# Patient Record
Sex: Male | Born: 1970 | Race: White | Hispanic: No | Marital: Single | Smoking: Current some day smoker
Health system: Southern US, Community
[De-identification: ages and names within clinical notes are randomized; demographics above are authoritative.]

## PROBLEM LIST (undated history)

## (undated) HISTORY — PX: LEG SURGERY: SHX1003

## (undated) HISTORY — PX: HEMORROIDECTOMY: SUR656

---

## 2013-01-19 ENCOUNTER — Emergency Department (HOSPITAL_BASED_OUTPATIENT_CLINIC_OR_DEPARTMENT_OTHER)
Admission: EM | Admit: 2013-01-19 | Discharge: 2013-01-19 | Disposition: A | Discharge status: Home / Self Care | Payer: Self-pay | Attending: Emergency Medicine | Admitting: Emergency Medicine

## 2013-01-19 ENCOUNTER — Encounter (HOSPITAL_BASED_OUTPATIENT_CLINIC_OR_DEPARTMENT_OTHER): Payer: Self-pay

## 2013-01-19 DIAGNOSIS — R51 Headache: Secondary | ICD-10-CM | POA: Insufficient documentation

## 2013-01-19 DIAGNOSIS — Y9289 Other specified places as the place of occurrence of the external cause: Secondary | ICD-10-CM | POA: Insufficient documentation

## 2013-01-19 DIAGNOSIS — W57XXXD Bitten or stung by nonvenomous insect and other nonvenomous arthropods, subsequent encounter: Secondary | ICD-10-CM

## 2013-01-19 DIAGNOSIS — W57XXXA Bitten or stung by nonvenomous insect and other nonvenomous arthropods, initial encounter: Secondary | ICD-10-CM | POA: Insufficient documentation

## 2013-01-19 DIAGNOSIS — Y939 Activity, unspecified: Secondary | ICD-10-CM | POA: Insufficient documentation

## 2013-01-19 DIAGNOSIS — Z87891 Personal history of nicotine dependence: Secondary | ICD-10-CM | POA: Insufficient documentation

## 2013-01-19 DIAGNOSIS — S0006XD Insect bite (nonvenomous) of scalp, subsequent encounter: Secondary | ICD-10-CM

## 2013-01-19 DIAGNOSIS — S1096XA Insect bite of unspecified part of neck, initial encounter: Secondary | ICD-10-CM | POA: Insufficient documentation

## 2013-01-19 DIAGNOSIS — IMO0001 Reserved for inherently not codable concepts without codable children: Secondary | ICD-10-CM | POA: Insufficient documentation

## 2013-01-19 DIAGNOSIS — R110 Nausea: Secondary | ICD-10-CM | POA: Insufficient documentation

## 2013-01-19 MED ORDER — DOXYCYCLINE HYCLATE 100 MG PO CAPS: 100.0000 mg | ORAL_CAPSULE | Freq: Two times a day (BID) | ORAL | Status: DC

## 2013-01-19 MED ORDER — HYDROCODONE-ACETAMINOPHEN 5-325 MG PO TABS: 2.0000 | ORAL_TABLET | ORAL | Status: DC | PRN

## 2013-01-19 NOTE — ED Notes (Signed)
Small reddened area noted to posterior aspect of left side of neck.

## 2013-01-19 NOTE — ED Provider Notes (Signed)
History     CSN: [id number]  Arrival date & time 01/19/13  1056   First MD Initiated Contact with Patient 01/19/13 1213      Chief Complaint  Patient presents with  . Tick Removal    (Consider location/radiation/quality/duration/timing/severity/associated sxs/prior treatment) Patient is a 42 y.o. male presenting with musculoskeletal pain. The history is provided by the patient. No language interpreter was used.  Muscle Pain This is a new problem. The current episode started today. The problem occurs constantly. The problem has been unchanged. Associated symptoms include headaches and nausea. Pertinent negatives include no fever. Nothing aggravates the symptoms. He has tried nothing for the symptoms. The treatment provided moderate relief.    History reviewed. No pertinent past medical history.  Past Surgical History  Procedure Laterality Date  . Leg surgery    . Hemorroidectomy      No family history on file.  History  Substance Use Topics  . Smoking status: Former Games developer  . Smokeless tobacco: Not on file  . Alcohol Use: Yes     Comment: occasional      Review of Systems  Constitutional: Negative for fever.  Gastrointestinal: Positive for nausea.  Neurological: Positive for headaches.  All other systems reviewed and are negative.    Allergies  Review of patient's allergies indicates no known allergies.  Home Medications   Current Outpatient Rx  Name  Route  Sig  Dispense  Refill  . doxycycline (VIBRAMYCIN) 100 MG capsule   Oral   Take 1 capsule (100 mg total) by mouth 2 (two) times daily.   28 capsule   0   . HYDROcodone-acetaminophen (NORCO/VICODIN) 5-325 MG per tablet   Oral   Take 2 tablets by mouth every 4 (four) hours as needed for pain.   10 tablet   0     BP 122/84  Pulse 73  Temp(Src) 97.4 F (36.3 C) (Oral)  Resp 20  Ht 5\' 11"  (1.803 m)  Wt 165 lb (74.844 kg)  BMI 23.02 kg/m2  SpO2 98%  Physical Exam  Nursing note and vitals  reviewed. Constitutional: He appears well-developed and well-nourished.  HENT:  Head: Normocephalic.  Right Ear: External ear normal.  Left Ear: External ear normal.  Nose: Nose normal.  Mouth/Throat: Oropharynx is clear and moist.  Eyes: Conjunctivae and EOM are normal. Pupils are equal, round, and reactive to light.  Neck: Normal range of motion.  Cardiovascular: Normal rate.   Pulmonary/Chest: Effort normal.  Abdominal: Soft.  Musculoskeletal: He exhibits edema.  Neurological: He is alert.  Skin: Skin is warm.  Psychiatric: He has a normal mood and affect.    ED Course  Procedures (including critical care time)  Labs Reviewed - No data to display No results found.   1. Tick bite of postauricular region, left, subsequent encounter       MDM  Pt given rx for hydrocodone.  Doxycycline.   Pt advised to follow up with family MD or at acute care clinic        Elson Areas, PA-C 01/19/13 1302

## 2013-01-19 NOTE — ED Notes (Signed)
Karen Sofia, PA-C at bedside 

## 2013-01-19 NOTE — ED Provider Notes (Signed)
Medical screening examination/treatment/procedure(s) were performed by non-physician practitioner and as supervising physician I was immediately available for consultation/collaboration.   Rolan Bucco, MD 01/19/13 [phone]

## 2013-01-19 NOTE — ED Notes (Signed)
Pt reports he removed a large tick from his neck Sunday and has not felt well since.  He reports sweats and a headache.

## 2013-03-28 ENCOUNTER — Encounter (HOSPITAL_BASED_OUTPATIENT_CLINIC_OR_DEPARTMENT_OTHER): Payer: Self-pay | Admitting: *Deleted

## 2013-03-28 ENCOUNTER — Emergency Department (HOSPITAL_BASED_OUTPATIENT_CLINIC_OR_DEPARTMENT_OTHER): Payer: Self-pay

## 2013-03-28 ENCOUNTER — Emergency Department (HOSPITAL_BASED_OUTPATIENT_CLINIC_OR_DEPARTMENT_OTHER)
Admission: EM | Admit: 2013-03-28 | Discharge: 2013-03-28 | Disposition: A | Discharge status: Home / Self Care | Payer: Self-pay | Attending: Emergency Medicine | Admitting: Emergency Medicine

## 2013-03-28 DIAGNOSIS — Z87891 Personal history of nicotine dependence: Secondary | ICD-10-CM | POA: Insufficient documentation

## 2013-03-28 DIAGNOSIS — J40 Bronchitis, not specified as acute or chronic: Secondary | ICD-10-CM

## 2013-03-28 DIAGNOSIS — R0982 Postnasal drip: Secondary | ICD-10-CM | POA: Insufficient documentation

## 2013-03-28 DIAGNOSIS — R6883 Chills (without fever): Secondary | ICD-10-CM | POA: Insufficient documentation

## 2013-03-28 DIAGNOSIS — L0390 Cellulitis, unspecified: Secondary | ICD-10-CM

## 2013-03-28 DIAGNOSIS — M549 Dorsalgia, unspecified: Secondary | ICD-10-CM | POA: Insufficient documentation

## 2013-03-28 DIAGNOSIS — R0602 Shortness of breath: Secondary | ICD-10-CM | POA: Insufficient documentation

## 2013-03-28 DIAGNOSIS — R062 Wheezing: Secondary | ICD-10-CM | POA: Insufficient documentation

## 2013-03-28 DIAGNOSIS — J069 Acute upper respiratory infection, unspecified: Secondary | ICD-10-CM | POA: Insufficient documentation

## 2013-03-28 DIAGNOSIS — J209 Acute bronchitis, unspecified: Secondary | ICD-10-CM | POA: Insufficient documentation

## 2013-03-28 DIAGNOSIS — J3489 Other specified disorders of nose and nasal sinuses: Secondary | ICD-10-CM | POA: Insufficient documentation

## 2013-03-28 DIAGNOSIS — R51 Headache: Secondary | ICD-10-CM | POA: Insufficient documentation

## 2013-03-28 DIAGNOSIS — L0291 Cutaneous abscess, unspecified: Secondary | ICD-10-CM | POA: Insufficient documentation

## 2013-03-28 MED ORDER — ALBUTEROL SULFATE HFA 108 (90 BASE) MCG/ACT IN AERS: 2.0000 | INHALATION_SPRAY | RESPIRATORY_TRACT | Status: DC | PRN

## 2013-03-28 MED ORDER — FLUTICASONE PROPIONATE 50 MCG/ACT NA SUSP: 2.0000 | Freq: Every day | NASAL | Status: DC

## 2013-03-28 MED ORDER — SULFAMETHOXAZOLE-TRIMETHOPRIM 800-160 MG PO TABS: 1.0000 | ORAL_TABLET | Freq: Two times a day (BID) | ORAL | Status: DC

## 2013-03-28 MED ORDER — HYDROCODONE-HOMATROPINE 5-1.5 MG/5ML PO SYRP: 5.0000 mL | ORAL_SOLUTION | Freq: Four times a day (QID) | ORAL | Status: DC | PRN

## 2013-03-28 MED ORDER — PREDNISONE 50 MG PO TABS
60.0000 mg | ORAL_TABLET | Freq: Once | ORAL | Status: AC
  Administered 2013-03-28: 60 mg via ORAL
  Filled 2013-03-28: qty 1

## 2013-03-28 MED ORDER — ALBUTEROL SULFATE (5 MG/ML) 0.5% IN NEBU
5.0000 mg | INHALATION_SOLUTION | Freq: Once | RESPIRATORY_TRACT | Status: AC
  Administered 2013-03-28: 5 mg via RESPIRATORY_TRACT
  Filled 2013-03-28: qty 1

## 2013-03-28 MED ORDER — HYDROCODONE-ACETAMINOPHEN 5-325 MG PO TABS
2.0000 | ORAL_TABLET | Freq: Once | ORAL | Status: AC
  Administered 2013-03-28: 2 via ORAL
  Filled 2013-03-28: qty 2

## 2013-03-28 MED ORDER — PREDNISONE 20 MG PO TABS: 40.0000 mg | ORAL_TABLET | Freq: Every day | ORAL | Status: DC

## 2013-03-28 NOTE — ED Provider Notes (Signed)
History    CSN: [id number] Arrival date & time 03/28/13  1409  First MD Initiated Contact with Patient 03/28/13 1504     Chief Complaint  Patient presents with  . Cough   (Consider location/radiation/quality/duration/timing/severity/associated sxs/prior Treatment) Patient is a 42 y.o. male presenting with cough. The history is provided by the patient and medical records. No language interpreter was used.  Cough Associated symptoms: chills, rhinorrhea, shortness of breath and wheezing   Associated symptoms: no chest pain, no diaphoresis, no ear pain, no fever, no headaches, no myalgias, no rash and no sore throat     Chanse Kagel is a 42 y.o. male  with no known medical hx presents to the Emergency Department complaining of gradual, persistent, progressively worseningcough, onset 10 days ago.. Associated symptoms include wheezing, back pain only with coughing, nasal congestion, mild generalized headaches.  Pt has been taking mucinex without relief.  No hx of asthma or allergies.  Hx of smoking and averages 50 cigarettes per 6 mos.  No sick contacts.  Nothing makes it better and Nothing makes it worse.  Pt denies ST, ear pain, abd pain, N/V/D, weakness, dizziness.    Pt also c/o tick bite which is sore, swollen and red.  The bite occurred 3 days ago and the tick was removed at home completely and without complication.  Denies fever, chills, rashes, fatigues, body aches.     History reviewed. No pertinent past medical history. Past Surgical History  Procedure Laterality Date  . Leg surgery    . Hemorroidectomy     No family history on file. History  Substance Use Topics  . Smoking status: Former Games developer  . Smokeless tobacco: Not on file  . Alcohol Use: Yes     Comment: occasional    Review of Systems  Constitutional: Positive for chills. Negative for fever, diaphoresis, appetite change, fatigue and unexpected weight change.  HENT: Positive for congestion, rhinorrhea, postnasal  drip and sinus pressure. Negative for ear pain, sore throat, mouth sores, neck stiffness and ear discharge.   Eyes: Negative for visual disturbance.  Respiratory: Positive for cough, shortness of breath and wheezing. Negative for chest tightness and stridor.   Cardiovascular: Negative for chest pain, palpitations and leg swelling.  Gastrointestinal: Negative for nausea, vomiting, abdominal pain, diarrhea and constipation.  Endocrine: Negative for polydipsia, polyphagia and polyuria.  Genitourinary: Negative for dysuria, urgency, frequency and hematuria.  Musculoskeletal: Positive for back pain. Negative for myalgias and arthralgias.  Skin: Negative for rash.  Allergic/Immunologic: Negative for immunocompromised state.  Neurological: Negative for syncope, light-headedness, numbness and headaches.  Hematological: Negative for adenopathy. Does not bruise/bleed easily.  Psychiatric/Behavioral: Negative for sleep disturbance. The patient is not nervous/anxious.   All other systems reviewed and are negative.    Allergies  Review of patient's allergies indicates no known allergies.  Home Medications   Current Outpatient Rx  Name  Route  Sig  Dispense  Refill  . doxycycline (VIBRAMYCIN) 100 MG capsule   Oral   Take 1 capsule (100 mg total) by mouth 2 (two) times daily.   28 capsule   0   . fluticasone (FLONASE) 50 MCG/ACT nasal spray   Nasal   Place 2 sprays into the nose daily.   16 g   2   . HYDROcodone-acetaminophen (NORCO/VICODIN) 5-325 MG per tablet   Oral   Take 2 tablets by mouth every 4 (four) hours as needed for pain.   10 tablet   0   . HYDROcodone-homatropine (  HYCODAN) 5-1.5 MG/5ML syrup   Oral   Take 5 mLs by mouth every 6 (six) hours as needed for cough.   120 mL   0   . predniSONE (DELTASONE) 20 MG tablet   Oral   Take 2 tablets (40 mg total) by mouth daily.   10 tablet   0   . sulfamethoxazole-trimethoprim (SEPTRA DS) 800-160 MG per tablet   Oral   Take  1 tablet by mouth every 12 (twelve) hours.   10 tablet   0    BP 129/100  Pulse 89  Temp(Src) 98.6 F (37 C) (Oral)  Resp 20  Wt 165 lb (74.844 kg)  BMI 23.02 kg/m2  SpO2 100% Physical Exam  Constitutional: He is oriented to person, place, and time. He appears well-developed and well-nourished. No distress.  HENT:  Head: Normocephalic and atraumatic.  Right Ear: Tympanic membrane, external ear and ear canal normal.  Left Ear: Tympanic membrane, external ear and ear canal normal.  Nose: Mucosal edema and rhinorrhea present. No epistaxis. Right sinus exhibits no maxillary sinus tenderness and no frontal sinus tenderness. Left sinus exhibits no maxillary sinus tenderness and no frontal sinus tenderness.  Mouth/Throat: Uvula is midline, oropharynx is clear and moist and mucous membranes are normal. Mucous membranes are not pale and not cyanotic. No oropharyngeal exudate, posterior oropharyngeal edema, posterior oropharyngeal erythema or tonsillar abscesses.  Eyes: Conjunctivae are normal. Pupils are equal, round, and reactive to light.  Neck: Normal range of motion and full passive range of motion without pain.  Cardiovascular: Normal rate, normal heart sounds and intact distal pulses.   No murmur heard. Pulmonary/Chest: Effort normal. No accessory muscle usage or stridor. Not tachypneic. No respiratory distress. He has decreased breath sounds. He has wheezes. He has no rhonchi. He has no rales. He exhibits no tenderness and no bony tenderness.  Abdominal: Soft. Bowel sounds are normal. There is no tenderness.  Musculoskeletal: Normal range of motion. He exhibits no tenderness.  Lymphadenopathy:    He has no cervical adenopathy.  Neurological: He is alert and oriented to person, place, and time. He exhibits normal muscle tone. Coordination normal.  Skin: Skin is warm and dry. He is not diaphoretic. There is erythema.  2x2 area of erythema and induration left upper back, no area of  fluctuance, no gross abscess No target lesion, no rashes on any other portion of the body  Psychiatric: He has a normal mood and affect. His behavior is normal.    ED Course  Procedures (including critical care time) Labs Reviewed - No data to display Dg Chest 2 View  03/28/2013   *RADIOLOGY REPORT*  Clinical Data: Cough, back pain  CHEST - 2 VIEW  Comparison: None.  Findings: Cardiomediastinal silhouette is unremarkable.  No acute infiltrate or pleural effusion.  No pulmonary edema.  Mild degenerative changes mid thoracic spine.  IMPRESSION:  No active disease.   Original Report Authenticated By: Natasha Mead, M.D.   1. Viral URI with cough   2. Cellulitis   3. Bronchitis     MDM  Eshawn Coor presents with tick bite and URI symptoms.  Pt CXR negative for acute infiltrate.  I personally reviewed the imaging tests through PACS system.  I reviewed available ER/hospitalization records through the EMR.   Patients symptoms are consistent with URI, likely viral etiology. Patient wheezing on initial exam; but wheezing resolved completely after albuterol neb.  Discussed that antibiotics are not indicated for viral infections but 2/2 to pts smoking  hx, long FHx Hx of COPD, length of symptoms (>10 days) and decreased f/u ability will d/c home with bactrim which will also cover his cellulitis.  Pt is without risk factors for HIV; no recent use of steroids or other immunosuppressive medications; no Hx of diabetes.  Pt is without gross abscess for which I&D would be possible.  Area marked and pt encouraged to return if redness begins to streak, extends beyond the markings, and/or fever or nausea/vomiting develop.  Pt is alert, oriented, NAD, afebrile, non tachycardic, nonseptic and nontoxic appearing. Pt will also be discharged with symptomatic treatment for URI.  Verbalizes understanding and is agreeable with plan. Pt is hemodynamically stable & in NAD prior to dc.  I have also discussed reasons to return  immediately to the ER.  Patient expresses understanding and agrees with plan.     Dahlia Client Mallarie Voorhies, PA-C 03/28/13 2015

## 2013-03-28 NOTE — ED Provider Notes (Signed)
Medical screening examination/treatment/procedure(s) were performed by non-physician practitioner and as supervising physician I was immediately available for consultation/collaboration.  Pedrohenrique Mcconville K Linker, MD 03/28/13 2043 

## 2013-03-28 NOTE — ED Notes (Signed)
Cough and back pain for 10 days. States he removed a tick from his back 3 days ago and would like to have it looked at.

## 2014-03-02 ENCOUNTER — Emergency Department (HOSPITAL_BASED_OUTPATIENT_CLINIC_OR_DEPARTMENT_OTHER)
Admission: EM | Admit: 2014-03-02 | Discharge: 2014-03-02 | Disposition: A | Discharge status: Home / Self Care | Payer: Self-pay | Attending: Emergency Medicine | Admitting: Emergency Medicine

## 2014-03-02 ENCOUNTER — Encounter (HOSPITAL_BASED_OUTPATIENT_CLINIC_OR_DEPARTMENT_OTHER): Payer: Self-pay | Admitting: Emergency Medicine

## 2014-03-02 ENCOUNTER — Emergency Department (HOSPITAL_BASED_OUTPATIENT_CLINIC_OR_DEPARTMENT_OTHER): Payer: Self-pay

## 2014-03-02 DIAGNOSIS — Z792 Long term (current) use of antibiotics: Secondary | ICD-10-CM | POA: Insufficient documentation

## 2014-03-02 DIAGNOSIS — Y9389 Activity, other specified: Secondary | ICD-10-CM | POA: Insufficient documentation

## 2014-03-02 DIAGNOSIS — IMO0002 Reserved for concepts with insufficient information to code with codable children: Secondary | ICD-10-CM | POA: Insufficient documentation

## 2014-03-02 DIAGNOSIS — S93401A Sprain of unspecified ligament of right ankle, initial encounter: Secondary | ICD-10-CM

## 2014-03-02 DIAGNOSIS — Y9289 Other specified places as the place of occurrence of the external cause: Secondary | ICD-10-CM | POA: Insufficient documentation

## 2014-03-02 DIAGNOSIS — S93409A Sprain of unspecified ligament of unspecified ankle, initial encounter: Secondary | ICD-10-CM | POA: Insufficient documentation

## 2014-03-02 DIAGNOSIS — Z79899 Other long term (current) drug therapy: Secondary | ICD-10-CM | POA: Insufficient documentation

## 2014-03-02 DIAGNOSIS — X500XXA Overexertion from strenuous movement or load, initial encounter: Secondary | ICD-10-CM | POA: Insufficient documentation

## 2014-03-02 DIAGNOSIS — Z87891 Personal history of nicotine dependence: Secondary | ICD-10-CM | POA: Insufficient documentation

## 2014-03-02 MED ORDER — OXYCODONE-ACETAMINOPHEN 5-325 MG PO TABS
1.0000 | ORAL_TABLET | Freq: Once | ORAL | Status: AC
  Administered 2014-03-02: 1 via ORAL
  Filled 2014-03-02: qty 1

## 2014-03-02 MED ORDER — IBUPROFEN 800 MG PO TABS: 800.0000 mg | ORAL_TABLET | Freq: Three times a day (TID) | ORAL | Status: DC

## 2014-03-02 MED ORDER — OXYCODONE-ACETAMINOPHEN 5-325 MG PO TABS: 1.0000 | ORAL_TABLET | ORAL | Status: DC | PRN

## 2014-03-02 NOTE — ED Notes (Signed)
Bruising and pain noted to foot and ankle

## 2014-03-02 NOTE — ED Provider Notes (Signed)
CSN: [id number]     Arrival date & time 03/02/14  2003 History   First MD Initiated Contact with Patient 03/02/14 2109     Chief Complaint  Patient presents with  . Ankle Injury     (Consider location/radiation/quality/duration/timing/severity/associated sxs/prior Treatment) Patient is a 43 y.o. male presenting with lower extremity injury. The history is provided by the patient. No language interpreter was used.  Ankle Injury This is a new problem. The current episode started yesterday. Pertinent negatives include no chills, fever or numbness. Associated symptoms comments: He stepped in a hole yesterday, wearing boots, and presents for evaluation of painful walking and discoloration to the foot and ankle. No other injury.Marland Kitchen    History reviewed. No pertinent past medical history. Past Surgical History  Procedure Laterality Date  . Leg surgery    . Hemorroidectomy     No family history on file. History  Substance Use Topics  . Smoking status: Former Research scientist (life sciences)  . Smokeless tobacco: Not on file  . Alcohol Use: Yes     Comment: occasional    Review of Systems  Constitutional: Negative for fever and chills.  Musculoskeletal:       See HPI  Skin: Positive for color change.  Neurological: Negative.  Negative for numbness.      Allergies  Review of patient's allergies indicates no known allergies.  Home Medications   Prior to Admission medications   Medication Sig Start Date End Date Taking? Authorizing Provider  doxycycline (VIBRAMYCIN) 100 MG capsule Take 1 capsule (100 mg total) by mouth 2 (two) times daily. 01/19/13   Fransico Meadow, PA-C  fluticasone [facility]) 50 MCG/ACT nasal spray Place 2 sprays into the nose daily. 03/28/13   Hannah Muthersbaugh, PA-C  HYDROcodone-acetaminophen (NORCO/VICODIN) 5-325 MG per tablet Take 2 tablets by mouth every 4 (four) hours as needed for pain. 01/19/13   Fransico Meadow, PA-C  HYDROcodone-homatropine [facility]) 5-1.5 MG/5ML syrup Take 5 mLs by  mouth every 6 (six) hours as needed for cough. 03/28/13   Hannah Muthersbaugh, PA-C  predniSONE (DELTASONE) 20 MG tablet Take 2 tablets (40 mg total) by mouth daily. 03/28/13   Hannah Muthersbaugh, PA-C  sulfamethoxazole-trimethoprim (SEPTRA DS) 800-160 MG per tablet Take 1 tablet by mouth every 12 (twelve) hours. 03/28/13   Hannah Muthersbaugh, PA-C   BP 120/85  Pulse 100  Temp(Src) 98 F (36.7 C) (Oral)  Resp 16  Ht 6' (1.829 m)  Wt 165 lb (74.844 kg)  BMI 22.37 kg/m2  SpO2 96% Physical Exam  Constitutional: He is oriented to person, place, and time. He appears well-developed and well-nourished. No distress.  Cardiovascular: Intact distal pulses.   Musculoskeletal:  Achilles intact. FROM ankle and foot. No joint instability.  Neurological: He is alert and oriented to person, place, and time.  Skin:  Right ankle ecchymotic laterally associated with swelling.      ED Course  Procedures (including critical care time) Labs Review Labs Reviewed - No data to display  Imaging Review Dg Ankle Complete Right  03/02/2014   CLINICAL DATA:  Ankle injury  EXAM: RIGHT ANKLE - COMPLETE 3+ VIEW  COMPARISON:  None.  FINDINGS: No fracture or dislocation is seen.  The ankle mortise is intact.  The base of the fifth metatarsal is unremarkable.  Mild tibiotalar degenerative changes.  Mild lateral soft tissue swelling.  IMPRESSION: No fracture or dislocation is seen.  Mild lateral soft tissue swelling.   Electronically Signed   By: Henderson Newcomer.D.  On: 03/02/2014 20:48   Dg Foot Complete Right  03/02/2014   CLINICAL DATA:  Stepped into a hole.  Pain and bruising of the foot.  EXAM: RIGHT FOOT COMPLETE - 3+ VIEW  COMPARISON:  None.  FINDINGS: A prominent hallux valgus deformity is present. No acute bone or soft tissue abnormality is present. Note is made of an os trigonum.  IMPRESSION: 1. No acute abnormality of the foot. 2. Prominent hallux valgus deformity.   Electronically Signed   By: Lawrence Santiago M.D.   On: 03/02/2014 20:47     EKG Interpretation None      MDM   Final diagnoses:  None    1. Right ankle sprain  Ankle has significant discoloration and changes of bruising. No evidence to suggest tendon compromise or rupture. No bony abnormalities on imagining. ASO supplied - patient has crutches. Will refer to Dr. Glynis Smiles for further management and recheck.     Dewaine Oats, PA-C 03/02/14 2127

## 2014-03-02 NOTE — Discharge Instructions (Signed)
Ankle Sprain °An ankle sprain is an injury to the strong, fibrous tissues (ligaments) that hold the bones of your ankle joint together.  °CAUSES °An ankle sprain is usually caused by a fall or by twisting your ankle. Ankle sprains most commonly occur when you step on the outer edge of your foot, and your ankle turns inward. People who participate in sports are more prone to these types of injuries.  °SYMPTOMS  °· Pain in your ankle. The pain may be present at rest or only when you are trying to stand or walk. °· Swelling. °· Bruising. Bruising may develop immediately or within 1 to 2 days after your injury. °· Difficulty standing or walking, particularly when turning corners or changing directions. °DIAGNOSIS  °Your caregiver will ask you details about your injury and perform a physical exam of your ankle to determine if you have an ankle sprain. During the physical exam, your caregiver will press on and apply pressure to specific areas of your foot and ankle. Your caregiver will try to move your ankle in certain ways. An X-ray exam may be done to be sure a bone was not broken or a ligament did not separate from one of the bones in your ankle (avulsion fracture).  °TREATMENT  °Certain types of braces can help stabilize your ankle. Your caregiver can make a recommendation for this. Your caregiver may recommend the use of medicine for pain. If your sprain is severe, your caregiver may refer you to a surgeon who helps to restore function to parts of your skeletal system (orthopedist) or a physical therapist. °HOME CARE INSTRUCTIONS  °· Apply ice to your injury for 1 2 days or as directed by your caregiver. Applying ice helps to reduce inflammation and pain. °· Put ice in a plastic bag. °· Place a towel between your skin and the bag. °· Leave the ice on for 15-20 minutes at a time, every 2 hours while you are awake. °· Only take over-the-counter or prescription medicines for pain, discomfort, or fever as directed by  your caregiver. °· Elevate your injured ankle above the level of your heart as much as possible for 2 3 days. °· If your caregiver recommends crutches, use them as instructed. Gradually put weight on the affected ankle. Continue to use crutches or a cane until you can walk without feeling pain in your ankle. °· If you have a plaster splint, wear the splint as directed by your caregiver. Do not rest it on anything harder than a pillow for the first 24 hours. Do not put weight on it. Do not get it wet. You may take it off to take a shower or bath. °· You may have been given an elastic bandage to wear around your ankle to provide support. If the elastic bandage is too tight (you have numbness or tingling in your foot or your foot becomes cold and blue), adjust the bandage to make it comfortable. °· If you have an air splint, you may blow more air into it or let air out to make it more comfortable. You may take your splint off at night and before taking a shower or bath. Wiggle your toes in the splint several times per day to decrease swelling. °SEEK MEDICAL CARE IF:  °· You have rapidly increasing bruising or swelling. °· Your toes feel extremely cold or you lose feeling in your foot. °· Your pain is not relieved with medicine. °SEEK IMMEDIATE MEDICAL CARE IF: °· Your toes are numb   or blue.  You have severe pain that is increasing. MAKE SURE YOU:   Understand these instructions.  Will watch your condition.  Will get help right away if you are not doing well or get worse. Document Released: 09/07/2005 Document Revised: 06/01/2012 Document Reviewed: 09/19/2011 [facility] Patient Information 2014 [place], [place].  Cryotherapy Cryotherapy means treatment with cold. Ice or gel packs can be used to reduce both pain and swelling. Ice is the most helpful within the first 24 to 48 hours after an injury or flareup from overusing a muscle or joint. Sprains, strains, spasms, burning pain, shooting pain, and aches can  all be eased with ice. Ice can also be used when recovering from surgery. Ice is effective, has very few side effects, and is safe for most people to use. PRECAUTIONS  Ice is not a safe treatment option for people with:  Raynaud's phenomenon. This is a condition affecting small blood vessels in the extremities. Exposure to cold may cause your problems to return.  Cold hypersensitivity. There are many forms of cold hypersensitivity, including:  Cold urticaria. Red, itchy hives appear on the skin when the tissues begin to warm after being iced.  Cold erythema. This is a red, itchy rash caused by exposure to cold.  Cold hemoglobinuria. Red blood cells break down when the tissues begin to warm after being iced. The hemoglobin that carry oxygen are passed into the urine because they cannot combine with blood proteins fast enough.  Numbness or altered sensitivity in the area being iced. If you have any of the following conditions, do not use ice until you have discussed cryotherapy with your caregiver:  Heart conditions, such as arrhythmia, angina, or chronic heart disease.  High blood pressure.  Healing wounds or open skin in the area being iced.  Current infections.  Rheumatoid arthritis.  Poor circulation.  Diabetes. Ice slows the blood flow in the region it is applied. This is beneficial when trying to stop inflamed tissues from spreading irritating chemicals to surrounding tissues. However, if you expose your skin to cold temperatures for too long or without the proper protection, you can damage your skin or nerves. Watch for signs of skin damage due to cold. HOME CARE INSTRUCTIONS Follow these tips to use ice and cold packs safely.  Place a dry or damp towel between the ice and skin. A damp towel will cool the skin more quickly, so you may need to shorten the time that the ice is used.  For a more rapid response, add gentle compression to the ice.  Ice for no more than 10 to 20  minutes at a time. The bonier the area you are icing, the less time it will take to get the benefits of ice.  Check your skin after 5 minutes to make sure there are no signs of a poor response to cold or skin damage.  Rest 20 minutes or more in between uses.  Once your skin is numb, you can end your treatment. You can test numbness by very lightly touching your skin. The touch should be so light that you do not see the skin dimple from the pressure of your fingertip. When using ice, most people will feel these normal sensations in this order: cold, burning, aching, and numbness.  Do not use ice on someone who cannot communicate their responses to pain, such as small children or people with dementia. HOW TO MAKE AN ICE PACK Ice packs are the most common way to use ice  therapy. Other methods include ice massage, ice baths, and cryo-sprays. Muscle creams that cause a cold, tingly feeling do not offer the same benefits that ice offers and should not be used as a substitute unless recommended by your caregiver. To make an ice pack, do one of the following:  Place crushed ice or a bag of frozen vegetables in a sealable plastic bag. Squeeze out the excess air. Place this bag inside another plastic bag. Slide the bag into a pillowcase or place a damp towel between your skin and the bag.  Mix 3 parts water with 1 part rubbing alcohol. Freeze the mixture in a sealable plastic bag. When you remove the mixture from the freezer, it will be slushy. Squeeze out the excess air. Place this bag inside another plastic bag. Slide the bag into a pillowcase or place a damp towel between your skin and the bag. SEEK MEDICAL CARE IF:  You develop white spots on your skin. This may give the skin a blotchy (mottled) appearance.  Your skin turns blue or pale.  Your skin becomes waxy or hard.  Your swelling gets worse. MAKE SURE YOU:   Understand these instructions.  Will watch your condition.  Will get help right  away if you are not doing well or get worse. Document Released: 05/04/2011 Document Revised: 11/30/2011 Document Reviewed: 05/04/2011 [facility] Patient Information 2014 [place], [place].

## 2014-03-02 NOTE — ED Notes (Signed)
Stepped in hole yesterday-pain to right ankle

## 2014-03-03 NOTE — ED Provider Notes (Signed)
Medical screening examination/treatment/procedure(s) were performed by non-physician practitioner and as supervising physician I was immediately available for consultation/collaboration.   EKG Interpretation None        Fredia Sorrow, MD 03/03/14 1643

## 2014-03-28 ENCOUNTER — Encounter (HOSPITAL_BASED_OUTPATIENT_CLINIC_OR_DEPARTMENT_OTHER): Payer: Self-pay | Admitting: Emergency Medicine

## 2014-03-28 ENCOUNTER — Emergency Department (HOSPITAL_BASED_OUTPATIENT_CLINIC_OR_DEPARTMENT_OTHER)
Admission: EM | Admit: 2014-03-28 | Discharge: 2014-03-28 | Disposition: A | Discharge status: Home / Self Care | Payer: Self-pay | Attending: Emergency Medicine | Admitting: Emergency Medicine

## 2014-03-28 DIAGNOSIS — S335XXA Sprain of ligaments of lumbar spine, initial encounter: Secondary | ICD-10-CM | POA: Insufficient documentation

## 2014-03-28 DIAGNOSIS — Y929 Unspecified place or not applicable: Secondary | ICD-10-CM | POA: Insufficient documentation

## 2014-03-28 DIAGNOSIS — D1739 Benign lipomatous neoplasm of skin and subcutaneous tissue of other sites: Secondary | ICD-10-CM | POA: Insufficient documentation

## 2014-03-28 DIAGNOSIS — S39012A Strain of muscle, fascia and tendon of lower back, initial encounter: Secondary | ICD-10-CM

## 2014-03-28 DIAGNOSIS — X500XXA Overexertion from strenuous movement or load, initial encounter: Secondary | ICD-10-CM | POA: Insufficient documentation

## 2014-03-28 DIAGNOSIS — Z791 Long term (current) use of non-steroidal anti-inflammatories (NSAID): Secondary | ICD-10-CM | POA: Insufficient documentation

## 2014-03-28 DIAGNOSIS — G589 Mononeuropathy, unspecified: Secondary | ICD-10-CM | POA: Insufficient documentation

## 2014-03-28 DIAGNOSIS — G629 Polyneuropathy, unspecified: Secondary | ICD-10-CM

## 2014-03-28 DIAGNOSIS — Z792 Long term (current) use of antibiotics: Secondary | ICD-10-CM | POA: Insufficient documentation

## 2014-03-28 DIAGNOSIS — D171 Benign lipomatous neoplasm of skin and subcutaneous tissue of trunk: Secondary | ICD-10-CM

## 2014-03-28 DIAGNOSIS — Y9389 Activity, other specified: Secondary | ICD-10-CM | POA: Insufficient documentation

## 2014-03-28 DIAGNOSIS — Z87891 Personal history of nicotine dependence: Secondary | ICD-10-CM | POA: Insufficient documentation

## 2014-03-28 DIAGNOSIS — IMO0002 Reserved for concepts with insufficient information to code with codable children: Secondary | ICD-10-CM | POA: Insufficient documentation

## 2014-03-28 MED ORDER — HYDROCODONE-ACETAMINOPHEN 5-325 MG PO TABS: 1.0000 | ORAL_TABLET | ORAL | Status: DC | PRN

## 2014-03-28 MED ORDER — HYDROCODONE-ACETAMINOPHEN 5-325 MG PO TABS
2.0000 | ORAL_TABLET | Freq: Once | ORAL | Status: AC
  Administered 2014-03-28: 2 via ORAL
  Filled 2014-03-28: qty 2

## 2014-03-28 MED ORDER — IBUPROFEN 800 MG PO TABS: 800.0000 mg | ORAL_TABLET | Freq: Three times a day (TID) | ORAL | Status: DC | PRN

## 2014-03-28 MED ORDER — IBUPROFEN 800 MG PO TABS
800.0000 mg | ORAL_TABLET | Freq: Once | ORAL | Status: AC
  Administered 2014-03-28: 800 mg via ORAL
  Filled 2014-03-28: qty 1

## 2014-03-28 NOTE — ED Provider Notes (Signed)
TIME SEEN: 12:31 PM  CHIEF COMPLAINT: Back pain for 3 days, left second toe numbness for 2 weeks  HPI: Patient is a 43 y.o. M with no significant past medical history who presents to the emergency department with complaints of bilateral back pain for the past 3 days that he thinks happened after playing or shoes. He states that he woke up on Sunday morning, 3 days it with back tightness, throbbing. No radiation of pain. No numbness, new tingling or focal weakness. No bowel or bladder incontinence. No fever. No history of back surgery or epidural injections.  He states he has had some intermittent tingling in his left second toe for 2 weeks. No other numbness. His extremities are warm with normal color. No injury to this toe. No chest pain.  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 It is not painful. It is not getting larger. It is not red, warm. There is no drainage.  ROS: See HPI Constitutional: no fever  Eyes: no drainage  ENT: no runny nose   Cardiovascular:  no chest pain  Resp: no SOB  GI: no vomiting GU: no dysuria Integumentary: no rash  Allergy: no hives  Musculoskeletal: no leg swelling  Neurological: no slurred speech ROS otherwise negative  PAST MEDICAL HISTORY/PAST SURGICAL HISTORY:  History reviewed. No pertinent past medical history.  MEDICATIONS:  Prior to Admission medications   Medication Sig Start Date End Date Taking? Authorizing Provider  acetaminophen (TYLENOL) 325 MG tablet Take 650 mg by mouth every 6 (six) hours as needed.   Yes Historical Provider, MD  doxycycline (VIBRAMYCIN) 100 MG capsule Take 1 capsule (100 mg total) by mouth 2 (two) times daily. 01/19/13   Fransico Meadow, PA-C  fluticasone [facility]) 50 MCG/ACT nasal spray Place 2 sprays into the nose daily. 03/28/13   Hannah Muthersbaugh, PA-C  HYDROcodone-acetaminophen (NORCO/VICODIN) 5-325 MG per tablet Take 2 tablets by mouth every 4  (four) hours as needed for pain. 01/19/13   Fransico Meadow, PA-C  HYDROcodone-homatropine [facility]) 5-1.5 MG/5ML syrup Take 5 mLs by mouth every 6 (six) hours as needed for cough. 03/28/13   Hannah Muthersbaugh, PA-C  ibuprofen (ADVIL,MOTRIN) 800 MG tablet Take 1 tablet (800 mg total) by mouth 3 (three) times daily. 03/02/14   Shari A Upstill, PA-C  oxyCODONE-acetaminophen (PERCOCET/ROXICET) 5-325 MG per tablet Take 1 tablet by mouth every 4 (four) hours as needed for severe pain. 03/02/14   Shari A Upstill, PA-C  predniSONE (DELTASONE) 20 MG tablet Take 2 tablets (40 mg total) by mouth daily. 03/28/13   Hannah Muthersbaugh, PA-C  sulfamethoxazole-trimethoprim (SEPTRA DS) 800-160 MG per tablet Take 1 tablet by mouth every 12 (twelve) hours. 03/28/13   Hannah Muthersbaugh, PA-C    ALLERGIES:  No Known Allergies  SOCIAL HISTORY:  History  Substance Use Topics  . Smoking status: Former Research scientist (life sciences)  . Smokeless tobacco: Not on file  . Alcohol Use: Yes     Comment: occasional    FAMILY HISTORY: No family history on file.  EXAM: BP 145/98  Pulse 70  Temp(Src) 98.2 F (36.8 C) (Oral)  Resp 14  Ht 6' (1.829 m)  Wt 165 lb (74.844 kg)  BMI 22.37 kg/m2  SpO2 100% CONSTITUTIONAL: Alert and oriented and responds appropriately to questions. Well-appearing; well-nourished, nontoxic, in no distress HEAD: Normocephalic EYES: Conjunctivae clear, PERRL ENT: normal nose; no rhinorrhea; moist mucous membranes; pharynx without lesions noted NECK: Supple,  no meningismus, no LAD  CARD: RRR; S1 and S2 appreciated; no murmurs, no clicks, no rubs, no gallops RESP: Normal chest excursion without splinting or tachypnea; breath sounds clear and equal bilaterally; no wheezes, no rhonchi, no rales,  ABD/GI: Normal bowel sounds; non-distended; soft, non-tender, no rebound, no guarding BACK:  The back appears normal and is only very mildly tender to palpation across the paraspinal musculature bilaterally in the lumbar spine  with no midline spinal tenderness or step-off or deformity, there is a 3 x 2 cm fatty soft tissue mass over the right SI joint is nontender to palpation and has no associated erythema or warmth or induration or fluctuance EXT: Patient presented has some tingling in his left second toe, toe has no obvious bony deformity or swelling or ecchymosis, no erythema or warmth, feet are warm and well-perfused, 2+ DP pulses bilaterally, otherwise sensation to light touch intact diffusely throughout the left lower extremity, no calf pain or swelling, no joint effusion, Normal ROM in all joints; non-tender to palpation; no edema; normal capillary refill; no cyanosis    SKIN: Normal color for age and race; warm NEURO: Moves all extremities equally; strength 5/5 in all 4 extremities, sensation to light touch intact diffusely, normal gait, 2+ Achilles and patellar reflexes bilaterally PSYCH: The patient's mood and manner are appropriate. Grooming and personal hygiene are appropriate.  MEDICAL DECISION MAKING: Patient here with lower back pain  I feel this is likely lumbar strain. I am not concerned for cauda equina, spinal stenosis, transverse myelitis, discitis or epidural abscess. I do not feel imaging is indicated today. We'll discharge with prescription for ibuprofen and Vicodin. 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 No signs of infection. I do not feel this is a malignancy. I do not feel this is the cause of his symptoms today. He is also complaining of 2 weeks of intermittent paresthesias only to his left second toe. This does not fit any distribution consistent with stroke or spinal lesion or radiculopathy. Suspect this is likely peripheral in nature. Will have him followup with his primary care physician for further evaluation. Patient verbalizes understanding. Discussed return precautions. Patient and family are comfortable with plan. Patient's significant other is at bedside to  drive him home.      [place], DO 03/28/14 1247

## 2014-03-28 NOTE — Discharge Instructions (Signed)
Lumbosacral Strain Lumbosacral strain is a strain of any of the parts that make up your lumbosacral vertebrae. Your lumbosacral vertebrae are the bones that make up the lower third of your backbone. Your lumbosacral vertebrae are held together by muscles and tough, fibrous tissue (ligaments).  CAUSES  A sudden blow to your back can cause lumbosacral strain. Also, anything that causes an excessive stretch of the muscles in the low back can cause this strain. This is typically seen when people exert themselves strenuously, fall, lift heavy objects, bend, or crouch repeatedly. RISK FACTORS  Physically demanding work.  Participation in pushing or pulling sports or sports that require a sudden twist of the back (tennis, golf, baseball).  Weight lifting.  Excessive lower back curvature.  Forward-tilted pelvis.  Weak back or abdominal muscles or both.  Tight hamstrings. SIGNS AND SYMPTOMS  Lumbosacral strain may cause pain in the area of your injury or pain that moves (radiates) down your leg.  DIAGNOSIS Your health care provider can often diagnose lumbosacral strain through a physical exam. In some cases, you may need tests such as X-ray exams.  TREATMENT  Treatment for your lower back injury depends on many factors that your clinician will have to evaluate. However, most treatment will include the use of anti-inflammatory medicines. HOME CARE INSTRUCTIONS   Avoid hard physical activities (tennis, racquetball, waterskiing) if you are not in proper physical condition for it. This may aggravate or create problems.  If you have a back problem, avoid sports requiring sudden body movements. Swimming and walking are generally safer activities.  Maintain good posture.  Maintain a healthy weight.  For acute conditions, you may put ice on the injured area.  Put ice in a plastic bag.  Place a towel between your skin and the bag.  Leave the ice on for 20 minutes, 2-3 times a day.  When the  low back starts healing, stretching and strengthening exercises may be recommended. SEEK MEDICAL CARE IF:  Your back pain is getting worse.  You experience severe back pain not relieved with medicines. SEEK IMMEDIATE MEDICAL CARE IF:   You have numbness, tingling, weakness, or problems with the use of your arms or legs.  There is a change in bowel or bladder control.  You have increasing pain in any area of the body, including your belly (abdomen).  You notice shortness of breath, dizziness, or feel faint.  You feel sick to your stomach (nauseous), are throwing up (vomiting), or become sweaty.  You notice discoloration of your toes or legs, or your feet get very cold. MAKE SURE YOU:   Understand these instructions.  Will watch your condition.  Will get help right away if you are not doing well or get worse. Document Released: 06/17/2005 Document Revised: 09/12/2013 Document Reviewed: 04/26/2013 [facility] Patient Information 2015 [place], [place]. This information is not intended to replace advice given to you by your health care provider. Make sure you discuss any questions you have with your health care provider.  Lipoma A lipoma is a noncancerous (benign) tumor composed of fat cells. They are usually found under the skin (subcutaneous). A lipoma may occur in any tissue of the body that contains fat. Common areas for lipomas to appear include the back, shoulders, buttocks, and thighs. Lipomas are a very common soft tissue growth. They are soft and grow slowly. Most problems caused by a lipoma depend on where it is growing. DIAGNOSIS  A lipoma can be diagnosed with a physical exam. These tumors  rarely become cancerous, but radiographic studies can help determine this for certain. Studies used may include:  Computerized X-ray scans (CT or CAT scan).  Computerized magnetic scans (MRI). TREATMENT  Small lipomas that are not causing problems may be watched. If a lipoma continues to  enlarge or causes problems, removal is often the best treatment. Lipomas can also be removed to improve appearance. Surgery is done to remove the fatty cells and the surrounding capsule. Most often, this is done with medicine that numbs the area (local anesthetic). The removed tissue is examined under a microscope to make sure it is not cancerous. Keep all follow-up appointments with your caregiver. SEEK MEDICAL CARE IF:   The lipoma becomes larger or hard.  The lipoma becomes painful, red, or increasingly swollen. These could be signs of infection or a more serious condition. Document Released: 08/28/2002 Document Revised: 11/30/2011 Document Reviewed: 02/07/2010 [facility] Patient Information 2015 [place], [place]. This information is not intended to replace advice given to you by your health care provider. Make sure you discuss any questions you have with your health care provider.    Peripheral Neuropathy Peripheral neuropathy is a type of nerve damage. It affects nerves that carry signals between the spinal cord and other parts of the body. These are called peripheral nerves. With peripheral neuropathy, one nerve or a group of nerves may be damaged.  CAUSES  Many things can damage peripheral nerves. For some people with peripheral neuropathy, the cause is unknown. Some causes include:  Diabetes. This is the most common cause of peripheral neuropathy.  Injury to a nerve.  Pressure or stress on a nerve that lasts a long time.  Too little vitamin B. Alcoholism can lead to this.  Infections.  Autoimmune diseases, such as multiple sclerosis and systemic lupus erythematosus.  Inherited nerve diseases.  Some medicines, such as cancer drugs.  Toxic substances, such as lead and mercury.  Too little blood flowing to the legs.  Kidney disease.  Thyroid disease. SIGNS AND SYMPTOMS  Different people have different symptoms. The symptoms you have will depend on which of your nerves is  damaged. Common symptoms include:  Loss of feeling (numbness) in the feet and hands.  Tingling in the feet and hands.  Pain that burns.  Very sensitive skin.  Weakness.  Not being able to move a part of the body (paralysis).  Muscle twitching.  Clumsiness or poor coordination.  Loss of balance.  Not being able to control your bladder.  Feeling dizzy.  Sexual problems. DIAGNOSIS  Peripheral neuropathy is a symptom, not a disease. Finding the cause of peripheral neuropathy can be hard. To figure that out, your health care provider will take a medical history and do a physical exam. A neurological exam will also be done. This involves checking things affected by your brain, spinal cord, and nerves (nervous system). For example, your health care provider will check your reflexes, how you move, and what you can feel.  Other types of tests may also be ordered, such as:  Blood tests.  A test of the fluid in your spinal cord.  Imaging tests, such as CT scans or an MRI.  Electromyography (EMG). This test checks the nerves that control muscles.  Nerve conduction velocity tests. These tests check how fast messages pass through your nerves.  Nerve biopsy. A small piece of nerve is removed. It is then checked under a microscope. TREATMENT   Medicine is often used to treat peripheral neuropathy. Medicines may include:  Pain-relieving medicines. Prescription or over-the-counter medicine may be suggested.  Antiseizure medicine. This may be used for pain.  Antidepressants. These also may help ease pain from neuropathy.  Lidocaine. This is a numbing medicine. You might wear a patch or be given a shot.  Mexiletine. This medicine is typically used to help control irregular heart rhythms.  Surgery. Surgery may be needed to relieve pressure on a nerve or to destroy a nerve that is causing pain.  Physical therapy to help movement.  Assistive devices to help movement. HOME CARE  INSTRUCTIONS   Only take over-the-counter or prescription medicines as directed by your health care provider. Follow the instructions carefully for any given medicines. Do not take any other medicines without first getting approval from your health care provider.  If you have diabetes, work closely with your health care provider to keep your blood sugar under control.  If you have numbness in your feet:  Check every day for signs of injury or infection. Watch for redness, warmth, and swelling.  Wear padded socks and comfortable shoes. These help protect your feet.  Do not do things that put pressure on your damaged nerve.  Do not smoke. Smoking keeps blood from getting to damaged nerves.  Avoid or limit alcohol. Too much alcohol can cause a lack of B vitamins. These vitamins are needed for healthy nerves.  Develop a good support system. Coping with peripheral neuropathy can be stressful. Talk to a mental health specialist or join a support group if you are struggling.  Follow up with your health care provider as directed. SEEK MEDICAL CARE IF:   You have new signs or symptoms of peripheral neuropathy.  You are struggling emotionally from dealing with peripheral neuropathy.  You have a fever. SEEK IMMEDIATE MEDICAL CARE IF:   You have an injury or infection that is not healing.  You feel very dizzy or begin vomiting.  You have chest pain.  You have trouble breathing. Document Released: 08/28/2002 Document Revised: 05/20/2011 Document Reviewed: 05/15/2013 [facility] Patient Information 2015 [place], [place]. This information is not intended to replace advice given to you by your health care provider. Make sure you discuss any questions you have with your health care provider.

## 2014-03-28 NOTE — ED Notes (Signed)
Patient states he has had bilateral lower back pain for the last three days.  States pain started after playing horseshoes.  States he has tingling in his left second toe as well.  States he also has a fatty tumor in his lower back.

## 2014-12-06 ENCOUNTER — Emergency Department (HOSPITAL_BASED_OUTPATIENT_CLINIC_OR_DEPARTMENT_OTHER): Payer: Self-pay

## 2014-12-06 ENCOUNTER — Encounter (HOSPITAL_BASED_OUTPATIENT_CLINIC_OR_DEPARTMENT_OTHER): Payer: Self-pay

## 2014-12-06 ENCOUNTER — Emergency Department (HOSPITAL_BASED_OUTPATIENT_CLINIC_OR_DEPARTMENT_OTHER)
Admission: EM | Admit: 2014-12-06 | Discharge: 2014-12-06 | Disposition: A | Discharge status: Home / Self Care | Payer: Self-pay | Attending: Emergency Medicine | Admitting: Emergency Medicine

## 2014-12-06 DIAGNOSIS — D171 Benign lipomatous neoplasm of skin and subcutaneous tissue of trunk: Secondary | ICD-10-CM | POA: Insufficient documentation

## 2014-12-06 DIAGNOSIS — S39012A Strain of muscle, fascia and tendon of lower back, initial encounter: Secondary | ICD-10-CM | POA: Insufficient documentation

## 2014-12-06 DIAGNOSIS — Y9389 Activity, other specified: Secondary | ICD-10-CM | POA: Insufficient documentation

## 2014-12-06 DIAGNOSIS — Z87891 Personal history of nicotine dependence: Secondary | ICD-10-CM | POA: Insufficient documentation

## 2014-12-06 DIAGNOSIS — S3992XA Unspecified injury of lower back, initial encounter: Secondary | ICD-10-CM

## 2014-12-06 DIAGNOSIS — W1839XA Other fall on same level, initial encounter: Secondary | ICD-10-CM | POA: Insufficient documentation

## 2014-12-06 DIAGNOSIS — Y9289 Other specified places as the place of occurrence of the external cause: Secondary | ICD-10-CM | POA: Insufficient documentation

## 2014-12-06 DIAGNOSIS — Y998 Other external cause status: Secondary | ICD-10-CM | POA: Insufficient documentation

## 2014-12-06 LAB — URINALYSIS, ROUTINE W REFLEX MICROSCOPIC
Bilirubin Urine: NEGATIVE
Glucose, UA: NEGATIVE mg/dL
Hgb urine dipstick: NEGATIVE
Ketones, ur: NEGATIVE mg/dL
LEUKOCYTES UA: NEGATIVE
Nitrite: NEGATIVE
PH: 6.5 (ref 5.0–8.0)
PROTEIN: NEGATIVE mg/dL
Specific Gravity, Urine: 1.017 (ref 1.005–1.030)
Urobilinogen, UA: 1 mg/dL (ref 0.0–1.0)

## 2014-12-06 MED ORDER — TRAMADOL HCL 50 MG PO TABS: 50.0000 mg | ORAL_TABLET | Freq: Four times a day (QID) | ORAL | Status: DC | PRN

## 2014-12-06 MED ORDER — METHOCARBAMOL 500 MG PO TABS: 500.0000 mg | ORAL_TABLET | Freq: Two times a day (BID) | ORAL | Status: DC

## 2014-12-06 MED ORDER — HYDROCODONE-ACETAMINOPHEN 5-325 MG PO TABS
1.0000 | ORAL_TABLET | Freq: Once | ORAL | Status: AC
  Administered 2014-12-06: 1 via ORAL
  Filled 2014-12-06: qty 1

## 2014-12-06 NOTE — ED Notes (Signed)
Back and testicular pain since Monday.  States he fell over a coffee table and now has continuous pain.

## 2014-12-06 NOTE — ED Provider Notes (Signed)
CSN: [id number]     Arrival date & time 12/06/14  27 History   First MD Initiated Contact with Patient 12/06/14 1623     Chief Complaint  Patient presents with  . Back Pain     (Consider location/radiation/quality/duration/timing/severity/associated sxs/prior Treatment) HPI   44 year old male who presents for evaluation of low back pain. Patient states 4 days ago he got up from his couch to walk to the bathroom when his left leg gave out, causing the patient to fell forward. He reached out his left arm to break the fall but twisted his low back in the process. He denies hitting his head or loss of consciousness. Since then he has been having severe sharp pain which radiates across his low back. Pain is persistent, worsening with sitting up or with movement. He has taken Tylenol and ibuprofen at home with minimal relief. He has also been using heating pad and ice pack some improvement. Pain radiates to the left testicle on occasion. No associated fever, headache, lightheadedness, abdominal pain, dysuria, hematuria, bowel bladder incontinence, saddle anesthesia, or rash. Denies prior history of low back pain. Patient admits that he does a lot of heavy lifting at work. He denies having any numbness to his lower leg. He denies having any significant arms pain.  History reviewed. No pertinent past medical history. Past Surgical History  Procedure Laterality Date  . Leg surgery    . Hemorroidectomy     No family history on file. History  Substance Use Topics  . Smoking status: Former Research scientist (life sciences)  . Smokeless tobacco: Not on file  . Alcohol Use: Yes     Comment: occasional    Review of Systems  All other systems reviewed and are negative.     Allergies  Review of patient's allergies indicates no known allergies.  Home Medications   Prior to Admission medications   Medication Sig Start Date End Date Taking? Authorizing Provider  acetaminophen (TYLENOL) 325 MG tablet Take 650 mg by  mouth every 6 (six) hours as needed.    Historical Provider, MD  ibuprofen (ADVIL,MOTRIN) 800 MG tablet Take 1 tablet (800 mg total) by mouth 3 (three) times daily. 03/02/14   Charlann Lange, PA-C  sulfamethoxazole-trimethoprim (SEPTRA DS) 800-160 MG per tablet Take 1 tablet by mouth every 12 (twelve) hours. 03/28/13   Hannah Muthersbaugh, PA-C   BP 133/91 mmHg  Pulse 81  Temp(Src) 98.4 F (36.9 C) (Oral)  Resp 18  Ht 6' (1.829 m)  Wt 165 lb (74.844 kg)  BMI 22.37 kg/m2  SpO2 99% Physical Exam  Constitutional: He appears well-developed and well-nourished. No distress.  HENT:  Head: Atraumatic.  Eyes: Conjunctivae are normal.  Neck: Normal range of motion. Neck supple.  Cardiovascular: Intact distal pulses.   Abdominal: Soft. There is no tenderness.  Genitourinary:  Chaperone present during exam. Mild tenderness to left testicle on palpation. No overlying skin changes, no scrotal swelling noted. No mass appreciated. Uncircumcised penis without rash. No inguinal hernia or inguinal lymphadenopathy. Testicles with normal lie  Musculoskeletal: He exhibits tenderness (tenderness to lumbar midline and paraspinal muscle on palpation with decreased low back flexion and rotation secondary to pain. No crepitus or step-off noted. No overlying skin changes.).  4 out 5 strength to bilateral lower extremities with poor effort. Intact patellar deep tendon reflex, no foot drop.  Neurological: He is alert.  Skin: No rash noted.  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 Nontender  Psychiatric: He has a normal  mood and affect.    ED Course  Procedures (including critical care time)  Patient here with low back pain, suspect muscular strain. He reported having testicle pain. On exam his genitalia is unremarkable no evidence to suggest infection. No red flags.  6:16 PM X-ray of L spine demonstrate severe degenerative disc disease at L4-L5 otherwise no acute finding. UA shows no evidence of  infection and no blood. I suspect patient may have muscle strain and less likely sciatica. He is able to ambulate.  Since patient has been using heat, ice, Tylenol or ibuprofen I will prescribe stronger pain med as needed.  Orthopedic referral given as needed. Return precaution discussed.    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 It occasionally will cause pain and discomfort. Patient request for outpatient follow-up. Will give referral to [facility] surgery for elective treatment as needed.  Labs Review Labs Reviewed  URINALYSIS, ROUTINE W REFLEX MICROSCOPIC    Imaging Review Dg Lumbar Spine Complete  12/06/2014   CLINICAL DATA:  Fall 3 days ago. Lumbar back pain. Initial encounter.  EXAM: LUMBAR SPINE - COMPLETE 4+ VIEW  COMPARISON:  None.  FINDINGS: There is no evidence of lumbar spine fracture.  Alignment is normal.  Severe degenerative disc disease with vacuum disc phenomenon noted at L4-5. Other disc spaces are maintained. Right-sided facet DJD also noted at L4-5. No other significant bone abnormality identified.  IMPRESSION: No acute findings.  Degenerative spondylosis, as described above.   Electronically Signed   By: Earle Gell M.D.   On: 12/06/2014 18:05     EKG Interpretation None      MDM   Final diagnoses:  Lower back injury, initial encounter  Low back strain, initial encounter  Lipoma of lower back    BP 133/91 mmHg  Pulse 81  Temp(Src) 98.4 F (36.9 C) (Oral)  Resp 18  Ht 6' (1.829 m)  Wt 165 lb (74.844 kg)  BMI 22.37 kg/m2  SpO2 99%  I have reviewed nursing notes and vital signs. I personally reviewed the imaging tests through PACS system  I reviewed available ER/hospitalization records thought the EMR     Domenic Moras, PA-C 12/06/14 [place], MD 12/07/14 [phone]

## 2014-12-06 NOTE — Discharge Instructions (Signed)

## 2015-04-17 ENCOUNTER — Encounter (HOSPITAL_BASED_OUTPATIENT_CLINIC_OR_DEPARTMENT_OTHER): Payer: Self-pay

## 2015-04-17 ENCOUNTER — Emergency Department (HOSPITAL_BASED_OUTPATIENT_CLINIC_OR_DEPARTMENT_OTHER)
Admission: EM | Admit: 2015-04-17 | Discharge: 2015-04-17 | Disposition: A | Discharge status: Home / Self Care | Payer: Self-pay | Attending: Emergency Medicine | Admitting: Emergency Medicine

## 2015-04-17 DIAGNOSIS — Y929 Unspecified place or not applicable: Secondary | ICD-10-CM | POA: Insufficient documentation

## 2015-04-17 DIAGNOSIS — T65891A Toxic effect of other specified substances, accidental (unintentional), initial encounter: Secondary | ICD-10-CM | POA: Insufficient documentation

## 2015-04-17 DIAGNOSIS — T304 Corrosion of unspecified body region, unspecified degree: Secondary | ICD-10-CM

## 2015-04-17 DIAGNOSIS — Y9389 Activity, other specified: Secondary | ICD-10-CM | POA: Insufficient documentation

## 2015-04-17 DIAGNOSIS — T50995A Adverse effect of other drugs, medicaments and biological substances, initial encounter: Secondary | ICD-10-CM | POA: Insufficient documentation

## 2015-04-17 DIAGNOSIS — X58XXXA Exposure to other specified factors, initial encounter: Secondary | ICD-10-CM | POA: Insufficient documentation

## 2015-04-17 DIAGNOSIS — Y998 Other external cause status: Secondary | ICD-10-CM | POA: Insufficient documentation

## 2015-04-17 DIAGNOSIS — Z720 Tobacco use: Secondary | ICD-10-CM | POA: Insufficient documentation

## 2015-04-17 DIAGNOSIS — M79644 Pain in right finger(s): Secondary | ICD-10-CM | POA: Insufficient documentation

## 2015-04-17 DIAGNOSIS — T23421A Corrosion of unspecified degree of single right finger (nail) except thumb, initial encounter: Secondary | ICD-10-CM | POA: Insufficient documentation

## 2015-04-17 MED ORDER — IBUPROFEN 400 MG PO TABS
600.0000 mg | ORAL_TABLET | Freq: Once | ORAL | Status: AC
  Administered 2015-04-17: 600 mg via ORAL
  Filled 2015-04-17 (×2): qty 1

## 2015-04-17 MED ORDER — IBUPROFEN 600 MG PO TABS: 600.0000 mg | ORAL_TABLET | Freq: Four times a day (QID) | ORAL | Status: AC | PRN

## 2015-04-17 NOTE — ED Provider Notes (Signed)
CSN: [id number]     Arrival date & time 04/17/15  1124 History   First MD Initiated Contact with Patient 04/17/15 1130     Chief Complaint  Patient presents with  . Hand Problem     (Consider location/radiation/quality/duration/timing/severity/associated sxs/prior Treatment) HPI Comments: Pt. Is a 44 y/o WM with no significant pmh who presents today after cutting the long finger on his right hand one week ago. 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 Shortly after he placed the tape on his finger, he says that he began having burning and pain in that hand with tingling all over. The skin on his fingers/ hand turned red and his hand began to swell. He did not develop any blisters. He says that the burning also developed in his other hand, though not to the same extent. He says that the pain was relieved by tylenol and placing his hand under running cold water. He denies leakage, drainage, or any other signs of infection. The erythema has improved, but the skin on his right and left hands has begun to peel. He also says that it is tender, and he has a difficult time closing his fingers due to pain and stiffness in his hand. He denies exposure to any other chemicals. He says he did not try any other treatments. He denies fever, chills, nausea, or vomiting. No numbness.   The history is provided by the patient.    History reviewed. No pertinent past medical history. Past Surgical History  Procedure Laterality Date  . Leg surgery    . Hemorroidectomy     No family history on file. History  Substance Use Topics  . Smoking status: Current Some Day Smoker  . Smokeless tobacco: Not on file  . Alcohol Use: Yes    Review of Systems  Constitutional: Negative.  Negative for fever, chills, appetite change and fatigue.  HENT: Negative.  Negative for congestion and sore throat.   Eyes: Negative.  Negative for pain and redness.    Respiratory: Negative.  Negative for cough, chest tightness and shortness of breath.   Cardiovascular: Negative.  Negative for chest pain and palpitations.  Gastrointestinal: Negative.  Negative for nausea and vomiting.  Endocrine: Negative.  Negative for cold intolerance and heat intolerance.  Genitourinary: Negative.  Negative for decreased urine volume.  Musculoskeletal: Positive for joint swelling. Negative for myalgias, back pain, arthralgias, gait problem, neck pain and neck stiffness.  Skin: Positive for color change and rash. Negative for pallor and wound.  Allergic/Immunologic: Negative.  Negative for environmental allergies and food allergies.  Neurological: Negative.  Negative for dizziness, tremors, weakness, light-headedness, numbness and headaches.  Hematological: Negative.   Psychiatric/Behavioral: Negative.  Negative for agitation.      Allergies  Review of patient's allergies indicates no known allergies.  Home Medications   Prior to Admission medications   Medication Sig Start Date End Date Taking? Authorizing Provider  ibuprofen (ADVIL,MOTRIN) 600 MG tablet Take 1 tablet (600 mg total) by mouth every 6 (six) hours as needed. 04/17/15   York Ram Daziah Hesler, MD   BP 134/88 mmHg  Pulse 71  Temp(Src) 98.3 F (36.8 C) (Oral)  Resp 16  Ht 5\' 11"  (1.803 m)  Wt 165 lb (74.844 kg)  BMI 23.02 kg/m2  SpO2 95% Physical Exam  Constitutional: He is oriented to person, place, and time. He appears well-developed and well-nourished. No distress.  HENT:  Head: Normocephalic and atraumatic.  Eyes: Conjunctivae and EOM are normal. Pupils are equal, round, and reactive to light.  Neck: Normal range of motion. Neck supple.  Cardiovascular: Normal rate, regular rhythm, normal heart sounds and intact distal pulses.  Exam reveals no gallop and no friction rub.   No murmur heard. Pulmonary/Chest: Effort normal and breath sounds normal. No respiratory distress. He has no wheezes. He  has no rales. He exhibits no tenderness.  Abdominal: Soft. Bowel sounds are normal. He exhibits no distension and no mass. There is no tenderness. There is no rebound and no guarding.  Musculoskeletal: Normal range of motion. He exhibits tenderness. He exhibits no edema.  Neurological: He is alert and oriented to person, place, and time. No cranial nerve deficit. He exhibits normal muscle tone.  Skin: Skin is warm and dry. Burn and rash noted. No bruising, no ecchymosis, no laceration, no lesion and no petechiae noted. He is not diaphoretic. No erythema. No pallor.     Psychiatric: He has a normal mood and affect. His behavior is normal.    ED Course  Procedures (including critical care time) Labs Review Labs Reviewed - No data to display  Imaging Review No results found.   EKG Interpretation None      MDM   Final diagnoses:  Chemical burn    Pt. i a 44 y/o M here with likely chemical burn due to exposure to noxious substance on the tape that he placed on his right finger to hold the laceration together. He has been using some steroid cream. Recommend symptomatic treatment with NSAID's for pain, triamcinolone - eucerin cream. Supportive chemical burn care at this point. Return precautions reviewed. Follow up with PCP as needed. Safe for discharge to home.      Aquilla Hacker, MD 04/17/15 1242  Blanchie Dessert, MD 04/18/15 1644

## 2015-04-17 NOTE — ED Notes (Addendum)
C/o skin peeling from both hands x 8 days-pain 10/10-no OTC pain meds

## 2015-04-17 NOTE — Discharge Instructions (Signed)
Chemical Burn Many chemicals can burn the skin. A chemical burn should be flushed with cool water and checked by an emergency caregiver. Your skin is a natural barrier to infection. It is the largest organ of your body. Burns damage this natural protection. To help prevent infection, it is very important to follow your caregiver's instructions in the care of your burn.  Many industrial chemicals may cause burns. These chemicals include acids, alkalis, and organic compounds such as petroleum, phenol, bitumen, tar, and grease. When acids come in contact with the skin, they cause an immediate change in the skin.Acid burns produce significant pain and form a scab (eschar). Usually, the immediate skin changes are the only damage from an acid burn.However, exposure to formic acid, chromic acid, or hydrofluoric acid may affect the whole body and may even be life-threatening. Alkalis include lye, cement, lime, and many chemicals with "hydroxide" in their name.An alkali burn may be less apparent than an acid burn at first. However, alkalis may cause greater tissue damage.It is important to be aware of any chemicals you are using. Treat any exposure to skin, eyes, or mucous membranes (nose, mouth, throat) as a potential emergency. PREVENTION  Avoid exposure to toxic chemicals that can cause burns.  Store chemicals out of the reach of children.  Use protective gloves when handling dangerous chemicals. HOME CARE INSTRUCTIONS   Wash your hands well before changing your bandage.  Change your bandage as often as directed by your caregiver.  Remove the old bandage. If the bandage sticks, you may soak it off with cool, clean water.  Cleanse the burn thoroughly but gently with mild soap and water.  Pat the area dry with a clean, dry cloth.  Apply a thin layer of antibacterial cream to the burn.  Apply a clean bandage as instructed by your caregiver.  Keep the bandage as clean and dry as  possible.  Elevate the affected area for the first 24 hours, then as instructed by your caregiver.  Only take over-the-counter or prescription medicines for pain, discomfort, or fever as directed by your caregiver.  Keep all follow-up appointments.This is important. This is how your caregiver can tell if your treatment is working. SEEK IMMEDIATE MEDICAL CARE IF:   You develop excessive pain.  You develop redness, tenderness, swelling, or red streaks near the burn.  The burned area develops yellowish-white fluid (pus) or a bad smell.  You have a fever. MAKE SURE YOU:   Understand these instructions.  Will watch your condition.  Will get help right away if you are not doing well or get worse. Document Released: 06/13/2004 Document Revised: 11/30/2011 Document Reviewed: 02/02/2011 [facility] Patient Information 2015 [place], [place]. This information is not intended to replace advice given to you by your health care provider. Make sure you discuss any questions you have with your health care provider.

## 2015-04-25 ENCOUNTER — Encounter (HOSPITAL_BASED_OUTPATIENT_CLINIC_OR_DEPARTMENT_OTHER): Payer: Self-pay

## 2015-04-25 ENCOUNTER — Emergency Department (HOSPITAL_BASED_OUTPATIENT_CLINIC_OR_DEPARTMENT_OTHER)
Admission: EM | Admit: 2015-04-25 | Discharge: 2015-04-25 | Disposition: A | Discharge status: Home / Self Care | Payer: Self-pay | Attending: Emergency Medicine | Admitting: Emergency Medicine

## 2015-04-25 DIAGNOSIS — L03011 Cellulitis of right finger: Secondary | ICD-10-CM | POA: Insufficient documentation

## 2015-04-25 DIAGNOSIS — Z720 Tobacco use: Secondary | ICD-10-CM | POA: Insufficient documentation

## 2015-04-25 MED ORDER — HYDROCODONE-ACETAMINOPHEN 5-325 MG PO TABS: 1.0000 | ORAL_TABLET | ORAL | Status: AC | PRN

## 2015-04-25 MED ORDER — CLINDAMYCIN PHOSPHATE 600 MG/50ML IV SOLN
600.0000 mg | Freq: Once | INTRAVENOUS | Status: AC
  Administered 2015-04-25: 600 mg via INTRAVENOUS
  Filled 2015-04-25: qty 50

## 2015-04-25 MED ORDER — CLINDAMYCIN HCL 300 MG PO CAPS: 300.0000 mg | ORAL_CAPSULE | Freq: Four times a day (QID) | ORAL | Status: AC

## 2015-04-25 NOTE — ED Notes (Signed)
Cut right middle 2.5 weeks ago with metal band that wraps boxes-c/o redness to finger x 3 days

## 2015-04-25 NOTE — ED Provider Notes (Addendum)
CSN: [id number]     Arrival date & time 04/25/15  1321 History   First MD Initiated Contact with Patient 04/25/15 1332     Chief Complaint  Patient presents with  . Finger Injury     (Consider location/radiation/quality/duration/timing/severity/associated sxs/prior Treatment) HPI Comments: Patient presents with redness and pain to his right middle finger. He states that he cut it at work about 2 and half weeks ago with a metal box cutter. He states he was a deep cut that he thinks went to the bone but he did not seek treatment after that. He was using some kind of tape around the bandage to hold the wound together. He then was seen in the emergency department on July 27 for redness to his hand which which looked like an allergic type reaction to the tape. He states that that is improving. He's been using triamcinolone cream. He states that 3 days ago he started having some redness and increased pain to the middle finger over the laceration. Yesterday he noticed a little drainage from the scab. He denies any fevers although he's had some nausea.   History reviewed. No pertinent past medical history. Past Surgical History  Procedure Laterality Date  . Leg surgery    . Hemorroidectomy     No family history on file. History  Substance Use Topics  . Smoking status: Current Some Day Smoker  . Smokeless tobacco: Not on file  . Alcohol Use: Yes    Review of Systems  Constitutional: Negative for fever.  Gastrointestinal: Negative for nausea and vomiting.  Musculoskeletal: Positive for joint swelling and arthralgias. Negative for back pain and neck pain.  Skin: Positive for wound.  Neurological: Negative for weakness, numbness and headaches.      Allergies  Review of patient's allergies indicates no known allergies.  Home Medications   Prior to Admission medications   Medication Sig Start Date End Date Taking? Authorizing Provider  clindamycin (CLEOCIN) 300 MG capsule Take 1 capsule  (300 mg total) by mouth 4 (four) times daily. X 7 days 04/25/15   Malvin Johns, MD  HYDROcodone-acetaminophen (NORCO/VICODIN) 5-325 MG per tablet Take 1-2 tablets by mouth every 4 (four) hours as needed. 04/25/15   Malvin Johns, MD  ibuprofen (ADVIL,MOTRIN) 600 MG tablet Take 1 tablet (600 mg total) by mouth every 6 (six) hours as needed. 04/17/15   York Ram Melancon, MD   BP 153/102 mmHg  Pulse 72  Temp(Src) 98.4 F (36.9 C) (Oral)  Resp 18  Ht 6' (1.829 m)  Wt 165 lb (74.844 kg)  BMI 22.37 kg/m2  SpO2 98% Physical Exam  Constitutional: He is oriented to person, place, and time. He appears well-developed and well-nourished.  HENT:  Head: Normocephalic and atraumatic.  Neck: Normal range of motion. Neck supple.  Cardiovascular: Normal rate.   Pulmonary/Chest: Effort normal.  Musculoskeletal: He exhibits edema and tenderness.  Patient has swelling and redness to the dorsal aspect of the right middle finger. There is a small healing laceration with overlying scab to the dorsal aspect of the DIP joint. The redness extends circumferentially around the finger indicating at the PIP joint. He has some flexion of the distal phalanx and cannot completely straighten that joint. He feels like this just started after the swelling started but was not 100% sure that it did not start after the initial laceration. He has some pain on palpation of the DIP joint. There some mild pain on range of motion the finger.  Neurological: He  is alert and oriented to person, place, and time.  Skin: Skin is warm and dry.  Psychiatric: He has a normal mood and affect.      ED Course  Procedures (including critical care time) Labs Review Labs Reviewed - No data to display  Imaging Review No results found.   EKG Interpretation None      MDM   Final diagnoses:  Cellulitis of finger of right hand    Discussed with Dr. Lenon Curt on call for hand.  I have a low suspicion for tenosynovitis. He doesn't have  significant pain on range of motion of the finger. He does have tenderness mostly to the DIP joint. I do have some concern for septic joint. I was given a dose of IV clindamycin. Dr. Lenon Curt is in agreement with having the patient have an outpatient trial of antibiotics and the patient can follow-up with him if his symptoms are not improving. I'm also concerned that he might have an injury to his extensor tendon as he cannot completely straighten his distal phalanx. I will refer patient to Dr. Lenon Curt for follow-up. His tetanus shot is up-to-date. He was advised to return to the emergency department if his symptoms worsen.  He was placed in a volar finger splint.   Malvin Johns, MD 04/25/15 [place], MD 04/25/15 1450

## 2015-04-25 NOTE — Discharge Instructions (Signed)

## 2017-03-17 IMAGING — DX DG LUMBAR SPINE COMPLETE 4+V
5 series · 5 of 5 positions shown · non-contrast
Comparison: None.

CLINICAL DATA: Fall 3 days ago. Lumbar back pain. Initial
encounter.

EXAM:
LUMBAR SPINE - COMPLETE 4+ VIEW

[l-spine ap]
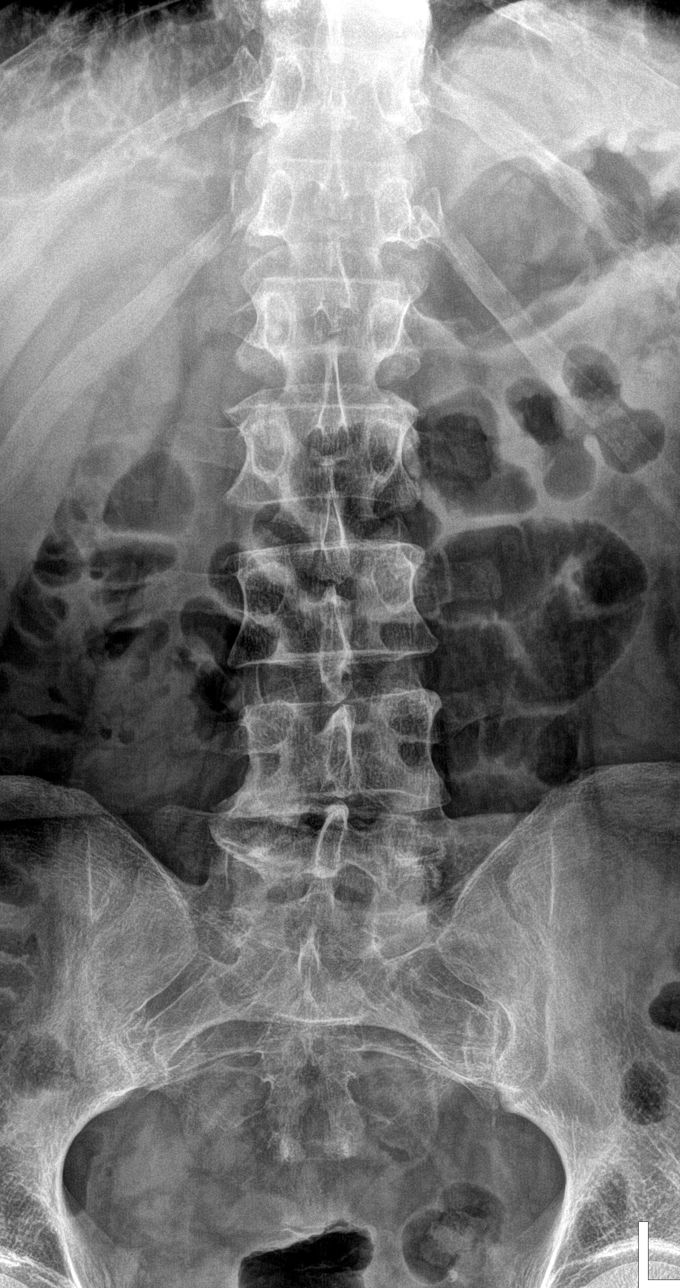

[l-spine obl (1 of 2)]
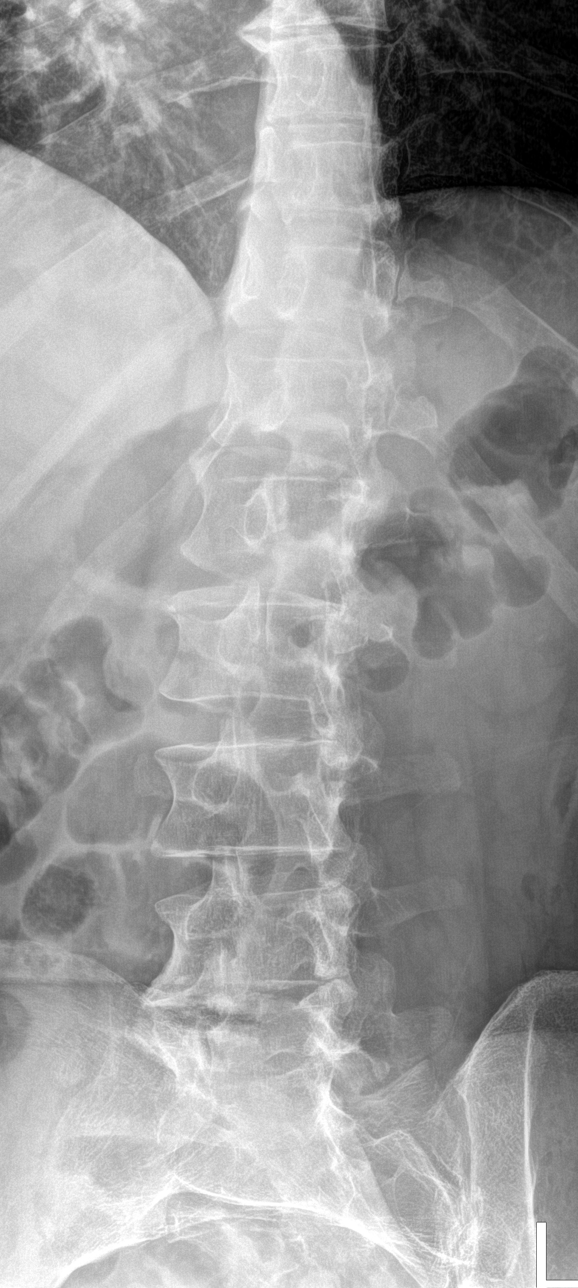

[l-spine obl (2 of 2)]
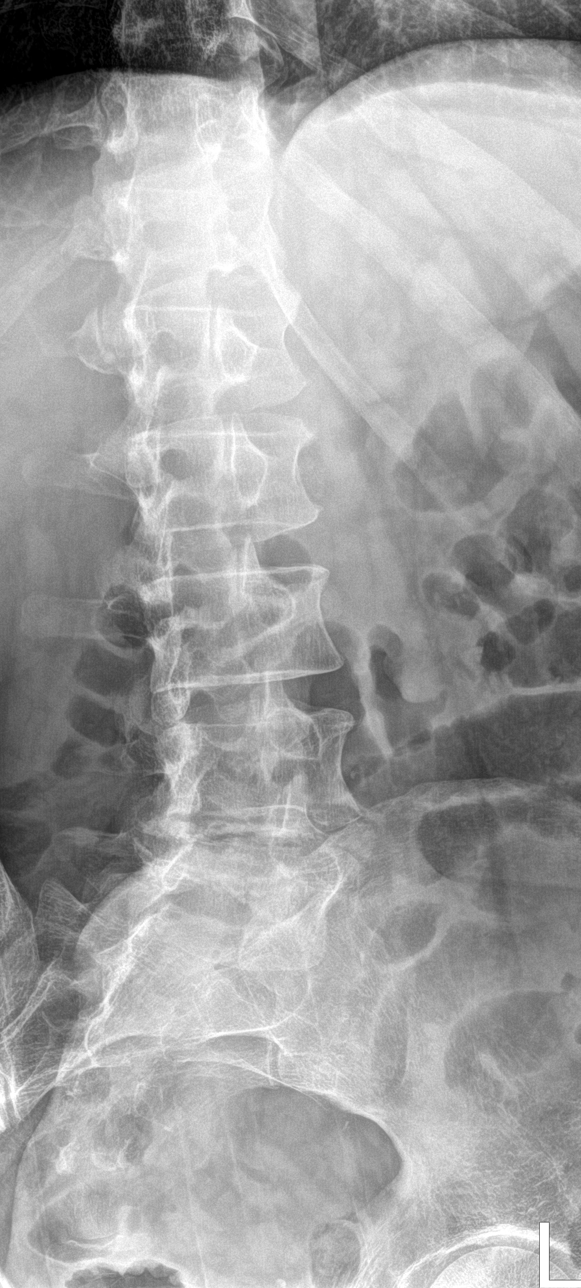

[l-spine lat]
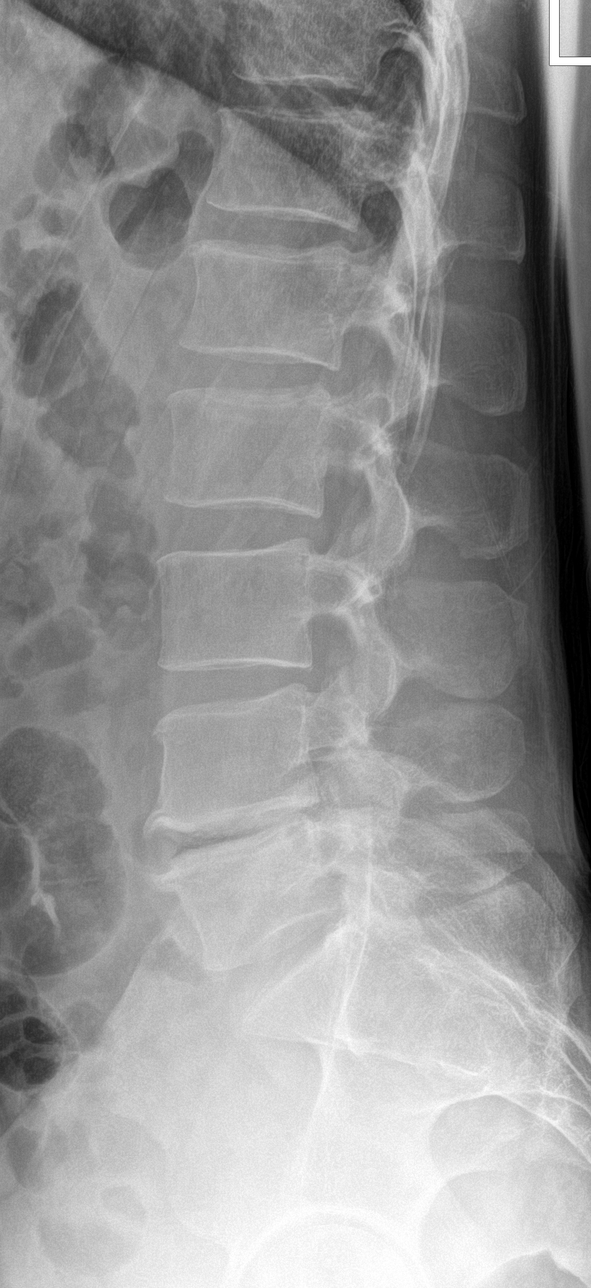

[l-spine spot]
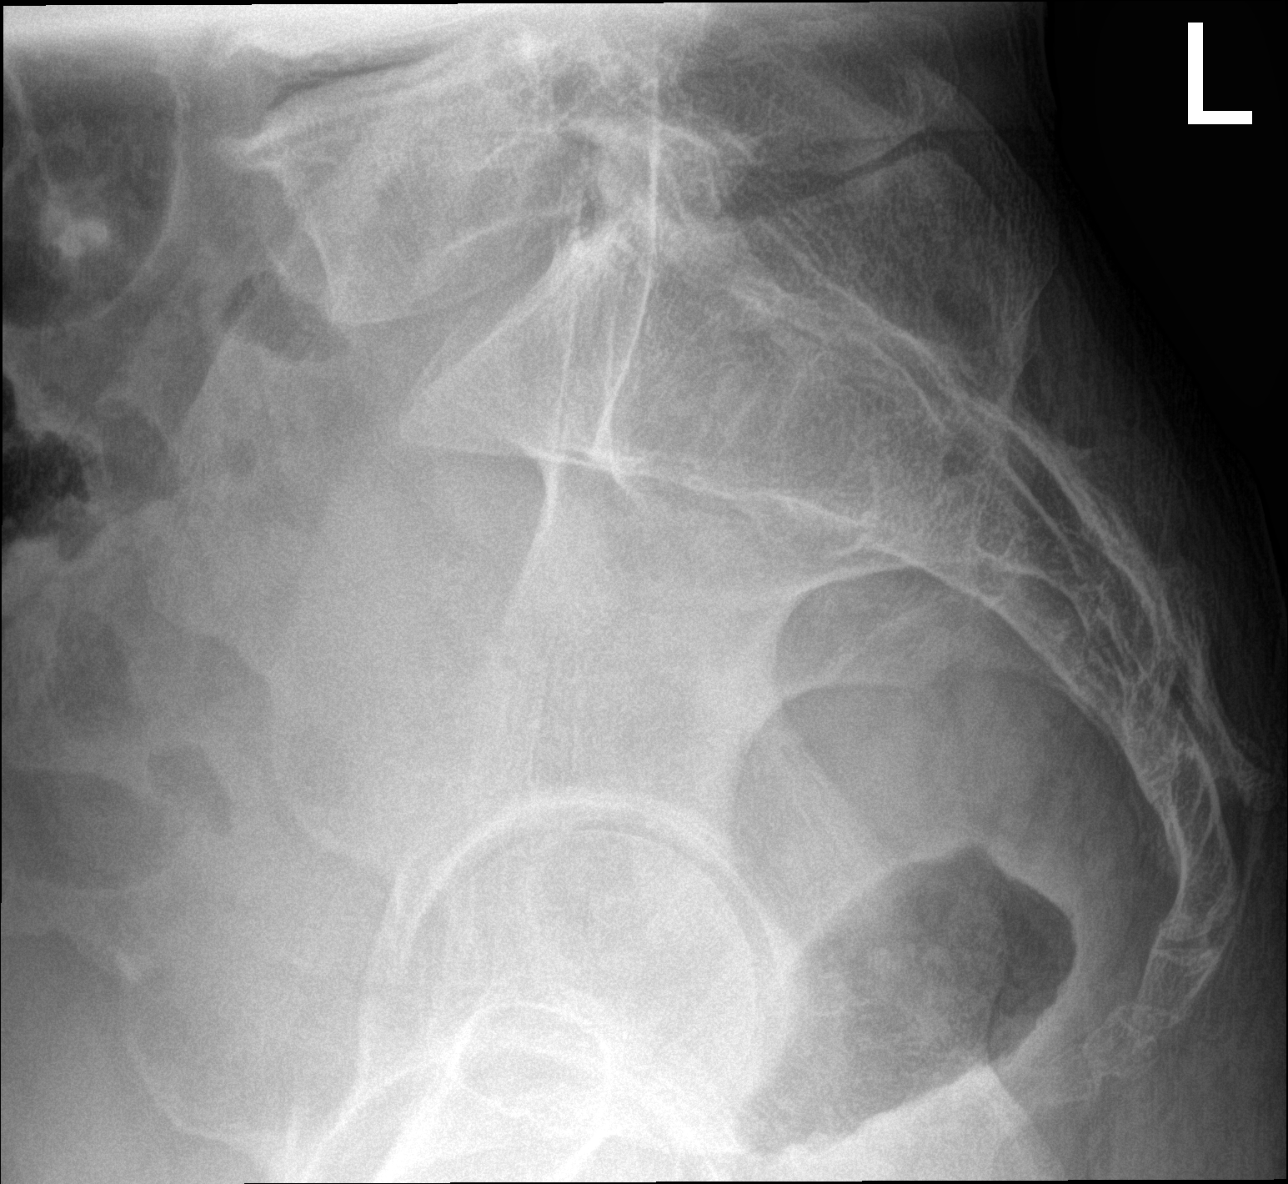

[5 of 5 positions shown; findings below may reference images not displayed]

FINDINGS: There is no evidence of lumbar spine fracture.  Alignment is normal.

Severe degenerative disc disease with vacuum disc phenomenon noted
at L4-5. Other disc spaces are maintained. Right-sided facet DJD
also noted at L4-5. No other significant bone abnormality
identified.
IMPRESSION: No acute findings.

Degenerative spondylosis, as described above.
